# Patient Record
Sex: Male | Born: 1960
Health system: Southern US, Community
[De-identification: ages and names within clinical notes are randomized; demographics above are authoritative.]

## PROBLEM LIST (undated history)

## (undated) HISTORY — PX: KNEE SURGERY: SHX244

---

## 2001-03-01 ENCOUNTER — Ambulatory Visit (HOSPITAL_COMMUNITY): Admission: RE | Admit: 2001-03-01 | Discharge: 2001-03-01 | Payer: Self-pay | Admitting: Specialist

## 2001-03-01 ENCOUNTER — Encounter: Payer: Self-pay | Admitting: Specialist

## 2011-01-27 ENCOUNTER — Ambulatory Visit (HOSPITAL_COMMUNITY)
Admission: RE | Admit: 2011-01-27 | Discharge: 2011-01-27 | Disposition: A | Discharge status: Home / Self Care | Payer: Managed Care, Other (non HMO) | Source: Ambulatory Visit | Attending: Orthopedic Surgery | Admitting: Orthopedic Surgery

## 2011-01-27 DIAGNOSIS — M79609 Pain in unspecified limb: Secondary | ICD-10-CM

## 2011-01-27 DIAGNOSIS — M7989 Other specified soft tissue disorders: Secondary | ICD-10-CM | POA: Insufficient documentation

## 2011-02-22 ENCOUNTER — Other Ambulatory Visit: Payer: Self-pay | Admitting: Orthopedic Surgery

## 2011-02-22 DIAGNOSIS — M25561 Pain in right knee: Secondary | ICD-10-CM

## 2011-02-24 ENCOUNTER — Ambulatory Visit
Admission: RE | Admit: 2011-02-24 | Discharge: 2011-02-24 | Disposition: A | Discharge status: Home / Self Care | Payer: Managed Care, Other (non HMO) | Source: Ambulatory Visit | Attending: Orthopedic Surgery | Admitting: Orthopedic Surgery

## 2011-02-24 DIAGNOSIS — M25561 Pain in right knee: Secondary | ICD-10-CM

## 2018-11-13 DIAGNOSIS — E7800 Pure hypercholesterolemia, unspecified: Secondary | ICD-10-CM | POA: Diagnosis not present

## 2018-11-13 DIAGNOSIS — Z125 Encounter for screening for malignant neoplasm of prostate: Secondary | ICD-10-CM | POA: Diagnosis not present

## 2018-11-13 DIAGNOSIS — Z0000 Encounter for general adult medical examination without abnormal findings: Secondary | ICD-10-CM | POA: Diagnosis not present

## 2019-04-30 DIAGNOSIS — E663 Overweight: Secondary | ICD-10-CM | POA: Diagnosis not present

## 2019-04-30 DIAGNOSIS — E7800 Pure hypercholesterolemia, unspecified: Secondary | ICD-10-CM | POA: Diagnosis not present

## 2019-05-01 DIAGNOSIS — E7800 Pure hypercholesterolemia, unspecified: Secondary | ICD-10-CM | POA: Diagnosis not present

## 2019-07-24 DIAGNOSIS — Z23 Encounter for immunization: Secondary | ICD-10-CM | POA: Diagnosis not present

## 2019-12-07 DIAGNOSIS — Z0000 Encounter for general adult medical examination without abnormal findings: Secondary | ICD-10-CM | POA: Diagnosis not present

## 2019-12-07 DIAGNOSIS — E538 Deficiency of other specified B group vitamins: Secondary | ICD-10-CM | POA: Diagnosis not present

## 2019-12-07 DIAGNOSIS — E7800 Pure hypercholesterolemia, unspecified: Secondary | ICD-10-CM | POA: Diagnosis not present

## 2019-12-07 DIAGNOSIS — Q740 Other congenital malformations of upper limb(s), including shoulder girdle: Secondary | ICD-10-CM | POA: Diagnosis not present

## 2019-12-07 DIAGNOSIS — Z125 Encounter for screening for malignant neoplasm of prostate: Secondary | ICD-10-CM | POA: Diagnosis not present

## 2020-06-24 DIAGNOSIS — Z8249 Family history of ischemic heart disease and other diseases of the circulatory system: Secondary | ICD-10-CM | POA: Diagnosis not present

## 2020-06-24 DIAGNOSIS — Z1389 Encounter for screening for other disorder: Secondary | ICD-10-CM | POA: Diagnosis not present

## 2020-06-24 DIAGNOSIS — E7800 Pure hypercholesterolemia, unspecified: Secondary | ICD-10-CM | POA: Diagnosis not present

## 2020-06-24 DIAGNOSIS — Q740 Other congenital malformations of upper limb(s), including shoulder girdle: Secondary | ICD-10-CM | POA: Diagnosis not present

## 2020-06-24 DIAGNOSIS — E663 Overweight: Secondary | ICD-10-CM | POA: Diagnosis not present

## 2020-07-10 DIAGNOSIS — Z23 Encounter for immunization: Secondary | ICD-10-CM | POA: Diagnosis not present

## 2020-10-14 DIAGNOSIS — Z1152 Encounter for screening for COVID-19: Secondary | ICD-10-CM | POA: Diagnosis not present

## 2021-02-05 DIAGNOSIS — Z0000 Encounter for general adult medical examination without abnormal findings: Secondary | ICD-10-CM | POA: Diagnosis not present

## 2021-02-05 DIAGNOSIS — Z125 Encounter for screening for malignant neoplasm of prostate: Secondary | ICD-10-CM | POA: Diagnosis not present

## 2021-02-05 DIAGNOSIS — E7800 Pure hypercholesterolemia, unspecified: Secondary | ICD-10-CM | POA: Diagnosis not present

## 2021-02-05 DIAGNOSIS — Z789 Other specified health status: Secondary | ICD-10-CM | POA: Diagnosis not present

## 2021-06-11 DIAGNOSIS — E7800 Pure hypercholesterolemia, unspecified: Secondary | ICD-10-CM | POA: Diagnosis not present

## 2021-08-14 ENCOUNTER — Emergency Department (HOSPITAL_BASED_OUTPATIENT_CLINIC_OR_DEPARTMENT_OTHER)
Admission: EM | Admit: 2021-08-14 | Discharge: 2021-08-14 | Disposition: A | Discharge status: Home / Self Care | Payer: BC Managed Care – PPO | Attending: Emergency Medicine | Admitting: Emergency Medicine

## 2021-08-14 ENCOUNTER — Encounter (HOSPITAL_BASED_OUTPATIENT_CLINIC_OR_DEPARTMENT_OTHER): Payer: Self-pay | Admitting: *Deleted

## 2021-08-14 ENCOUNTER — Emergency Department (HOSPITAL_BASED_OUTPATIENT_CLINIC_OR_DEPARTMENT_OTHER): Payer: BC Managed Care – PPO

## 2021-08-14 ENCOUNTER — Other Ambulatory Visit: Payer: Self-pay

## 2021-08-14 DIAGNOSIS — M25511 Pain in right shoulder: Secondary | ICD-10-CM | POA: Insufficient documentation

## 2021-08-14 DIAGNOSIS — S2241XA Multiple fractures of ribs, right side, initial encounter for closed fracture: Secondary | ICD-10-CM

## 2021-08-14 DIAGNOSIS — I700 Atherosclerosis of aorta: Secondary | ICD-10-CM | POA: Diagnosis not present

## 2021-08-14 DIAGNOSIS — Y92410 Unspecified street and highway as the place of occurrence of the external cause: Secondary | ICD-10-CM | POA: Diagnosis not present

## 2021-08-14 DIAGNOSIS — S40011A Contusion of right shoulder, initial encounter: Secondary | ICD-10-CM | POA: Diagnosis not present

## 2021-08-14 DIAGNOSIS — S299XXA Unspecified injury of thorax, initial encounter: Secondary | ICD-10-CM | POA: Diagnosis not present

## 2021-08-14 DIAGNOSIS — J9811 Atelectasis: Secondary | ICD-10-CM | POA: Diagnosis not present

## 2021-08-14 DIAGNOSIS — R0781 Pleurodynia: Secondary | ICD-10-CM | POA: Diagnosis not present

## 2021-08-14 MED ORDER — HYDROCODONE-ACETAMINOPHEN 5-325 MG PO TABS
1.0000 | ORAL_TABLET | Freq: Four times a day (QID) | ORAL | 0 refills | Status: DC | PRN
Start: 2021-08-14 — End: 2022-09-09

## 2021-08-14 MED ORDER — NAPROXEN 500 MG PO TABS: 500.0000 mg | ORAL_TABLET | Freq: Two times a day (BID) | ORAL | 0 refills | Status: DC
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
## 2021-08-14 NOTE — ED Notes (Signed)
Incentive spirometer given. Pt educated. Pt verbalizes understanding

## 2021-08-14 NOTE — ED Provider Notes (Signed)
8:28 PM Signout from [employer].   Patient's x-rays reviewed.  He has multiple rib fractures.  No shoulder dislocation or obvious fracture.  Seen and evaluated.  He looks comfortable but has worsening pain with movement of his arm.  Pain is mainly in the chest wall per his report.  Given extensive number of rib fractures, recommended chest CT to evaluate for other internal injuries.  Patient in agreement.  Family at bedside also agrees.  9:28 PM CT with 3 rib fractures. Pt updated. No other serious injuries identified.   BP 123/78 (BP Location: Left Arm)   Pulse 73   Temp 98.3 F (36.8 C) (Oral)   Resp 14   Ht 6' (1.829 m)   Wt 92.1 kg   SpO2 100%   BMI 27.53 kg/m   Plan: pain meds, shoulder sling, spirometer for home. PCP f/u in 1 week to discuss return to work.   Patient counseled on use of narcotic pain medications. Counseled not to combine these medications with others containing tylenol. Urged not to drink alcohol, drive, or perform any other activities that requires focus while taking these medications. The patient verbalizes understanding and agrees with the plan.    Renne Crigler, PA-C 08/14/21 2346    Melene Plan, DO 08/17/21 0700

## 2021-08-20 DIAGNOSIS — S4991XA Unspecified injury of right shoulder and upper arm, initial encounter: Secondary | ICD-10-CM | POA: Diagnosis not present

## 2021-08-20 DIAGNOSIS — Z23 Encounter for immunization: Secondary | ICD-10-CM | POA: Diagnosis not present

## 2021-08-20 DIAGNOSIS — S2241XA Multiple fractures of ribs, right side, initial encounter for closed fracture: Secondary | ICD-10-CM | POA: Diagnosis not present

## 2021-08-24 DIAGNOSIS — S43101A Unspecified dislocation of right acromioclavicular joint, initial encounter: Secondary | ICD-10-CM | POA: Diagnosis not present

## 2021-08-24 DIAGNOSIS — S2241XA Multiple fractures of ribs, right side, initial encounter for closed fracture: Secondary | ICD-10-CM | POA: Diagnosis not present

## 2021-09-11 DIAGNOSIS — Z8249 Family history of ischemic heart disease and other diseases of the circulatory system: Secondary | ICD-10-CM | POA: Diagnosis not present

## 2021-09-11 DIAGNOSIS — E7800 Pure hypercholesterolemia, unspecified: Secondary | ICD-10-CM | POA: Diagnosis not present

## 2021-09-11 DIAGNOSIS — S43101A Unspecified dislocation of right acromioclavicular joint, initial encounter: Secondary | ICD-10-CM | POA: Diagnosis not present

## 2021-09-11 DIAGNOSIS — S4991XA Unspecified injury of right shoulder and upper arm, initial encounter: Secondary | ICD-10-CM | POA: Diagnosis not present

## 2021-09-14 DIAGNOSIS — S43101D Unspecified dislocation of right acromioclavicular joint, subsequent encounter: Secondary | ICD-10-CM | POA: Diagnosis not present

## 2021-10-07 DIAGNOSIS — S43101D Unspecified dislocation of right acromioclavicular joint, subsequent encounter: Secondary | ICD-10-CM | POA: Diagnosis not present

## 2022-02-08 DIAGNOSIS — Z0000 Encounter for general adult medical examination without abnormal findings: Secondary | ICD-10-CM | POA: Diagnosis not present

## 2022-02-11 DIAGNOSIS — Z125 Encounter for screening for malignant neoplasm of prostate: Secondary | ICD-10-CM | POA: Diagnosis not present

## 2022-02-11 DIAGNOSIS — E7800 Pure hypercholesterolemia, unspecified: Secondary | ICD-10-CM | POA: Diagnosis not present

## 2022-02-11 DIAGNOSIS — N401 Enlarged prostate with lower urinary tract symptoms: Secondary | ICD-10-CM | POA: Diagnosis not present

## 2022-02-11 DIAGNOSIS — Z789 Other specified health status: Secondary | ICD-10-CM | POA: Diagnosis not present

## 2022-02-11 DIAGNOSIS — N529 Male erectile dysfunction, unspecified: Secondary | ICD-10-CM | POA: Diagnosis not present

## 2022-03-16 DIAGNOSIS — H903 Sensorineural hearing loss, bilateral: Secondary | ICD-10-CM | POA: Diagnosis not present

## 2022-04-27 DIAGNOSIS — R3914 Feeling of incomplete bladder emptying: Secondary | ICD-10-CM | POA: Diagnosis not present

## 2022-04-27 DIAGNOSIS — N401 Enlarged prostate with lower urinary tract symptoms: Secondary | ICD-10-CM | POA: Diagnosis not present

## 2022-04-27 DIAGNOSIS — R3912 Poor urinary stream: Secondary | ICD-10-CM | POA: Diagnosis not present

## 2022-07-19 DIAGNOSIS — R3912 Poor urinary stream: Secondary | ICD-10-CM | POA: Diagnosis not present

## 2022-07-19 DIAGNOSIS — R3914 Feeling of incomplete bladder emptying: Secondary | ICD-10-CM | POA: Diagnosis not present

## 2022-07-19 DIAGNOSIS — N401 Enlarged prostate with lower urinary tract symptoms: Secondary | ICD-10-CM | POA: Diagnosis not present

## 2022-07-19 DIAGNOSIS — H903 Sensorineural hearing loss, bilateral: Secondary | ICD-10-CM | POA: Diagnosis not present

## 2022-07-26 DIAGNOSIS — R3912 Poor urinary stream: Secondary | ICD-10-CM | POA: Diagnosis not present

## 2022-07-26 DIAGNOSIS — R3914 Feeling of incomplete bladder emptying: Secondary | ICD-10-CM | POA: Diagnosis not present

## 2022-07-26 DIAGNOSIS — N139 Obstructive and reflux uropathy, unspecified: Secondary | ICD-10-CM | POA: Diagnosis not present

## 2022-07-26 DIAGNOSIS — N401 Enlarged prostate with lower urinary tract symptoms: Secondary | ICD-10-CM | POA: Diagnosis not present

## 2022-09-01 ENCOUNTER — Ambulatory Visit
Admission: RE | Admit: 2022-09-01 | Discharge: 2022-09-01 | Disposition: A | Discharge status: Home / Self Care | Payer: BC Managed Care – PPO | Source: Ambulatory Visit | Attending: Family Medicine | Admitting: Family Medicine

## 2022-09-01 ENCOUNTER — Other Ambulatory Visit: Payer: Self-pay | Admitting: Family Medicine

## 2022-09-01 DIAGNOSIS — M47816 Spondylosis without myelopathy or radiculopathy, lumbar region: Secondary | ICD-10-CM | POA: Diagnosis not present

## 2022-09-01 DIAGNOSIS — M25552 Pain in left hip: Secondary | ICD-10-CM | POA: Diagnosis not present

## 2022-09-01 DIAGNOSIS — M160 Bilateral primary osteoarthritis of hip: Secondary | ICD-10-CM | POA: Diagnosis not present

## 2022-09-01 DIAGNOSIS — M25551 Pain in right hip: Secondary | ICD-10-CM

## 2022-09-01 DIAGNOSIS — M47817 Spondylosis without myelopathy or radiculopathy, lumbosacral region: Secondary | ICD-10-CM | POA: Diagnosis not present

## 2022-09-08 DIAGNOSIS — R3912 Poor urinary stream: Secondary | ICD-10-CM | POA: Diagnosis not present

## 2022-09-08 DIAGNOSIS — R3914 Feeling of incomplete bladder emptying: Secondary | ICD-10-CM | POA: Diagnosis not present

## 2022-09-08 DIAGNOSIS — N401 Enlarged prostate with lower urinary tract symptoms: Secondary | ICD-10-CM | POA: Diagnosis not present

## 2022-09-09 ENCOUNTER — Encounter (HOSPITAL_BASED_OUTPATIENT_CLINIC_OR_DEPARTMENT_OTHER): Payer: Self-pay | Admitting: Urology

## 2022-09-09 ENCOUNTER — Other Ambulatory Visit (HOSPITAL_BASED_OUTPATIENT_CLINIC_OR_DEPARTMENT_OTHER): Payer: Self-pay

## 2022-09-09 ENCOUNTER — Emergency Department (HOSPITAL_BASED_OUTPATIENT_CLINIC_OR_DEPARTMENT_OTHER)
Admission: EM | Admit: 2022-09-09 | Discharge: 2022-09-09 | Disposition: A | Discharge status: Home / Self Care | Payer: BC Managed Care – PPO | Attending: Emergency Medicine | Admitting: Emergency Medicine

## 2022-09-09 ENCOUNTER — Emergency Department (HOSPITAL_BASED_OUTPATIENT_CLINIC_OR_DEPARTMENT_OTHER): Payer: BC Managed Care – PPO

## 2022-09-09 ENCOUNTER — Other Ambulatory Visit: Payer: Self-pay

## 2022-09-09 DIAGNOSIS — N132 Hydronephrosis with renal and ureteral calculous obstruction: Secondary | ICD-10-CM | POA: Diagnosis not present

## 2022-09-09 DIAGNOSIS — K6389 Other specified diseases of intestine: Secondary | ICD-10-CM | POA: Diagnosis not present

## 2022-09-09 DIAGNOSIS — R1031 Right lower quadrant pain: Secondary | ICD-10-CM | POA: Diagnosis not present

## 2022-09-09 DIAGNOSIS — R6883 Chills (without fever): Secondary | ICD-10-CM | POA: Diagnosis not present

## 2022-09-09 DIAGNOSIS — I700 Atherosclerosis of aorta: Secondary | ICD-10-CM | POA: Diagnosis not present

## 2022-09-09 DIAGNOSIS — N3289 Other specified disorders of bladder: Secondary | ICD-10-CM | POA: Diagnosis not present

## 2022-09-09 DIAGNOSIS — R112 Nausea with vomiting, unspecified: Secondary | ICD-10-CM | POA: Diagnosis not present

## 2022-09-09 LAB — CBC
HCT: 45.1 % (ref 39.0–52.0)
Hemoglobin: 15.4 g/dL (ref 13.0–17.0)
MCH: 31.4 pg (ref 26.0–34.0)
MCHC: 34.1 g/dL (ref 30.0–36.0)
MCV: 91.9 fL (ref 80.0–100.0)
Platelets: 260 10*3/uL (ref 150–400)
RBC: 4.91 MIL/uL (ref 4.22–5.81)
RDW: 12 % (ref 11.5–15.5)
WBC: 11.4 10*3/uL — ABNORMAL HIGH (ref 4.0–10.5)
nRBC: 0 % (ref 0.0–0.2)

## 2022-09-09 LAB — COMPREHENSIVE METABOLIC PANEL
ALT: 19 U/L (ref 0–44)
AST: 30 U/L (ref 15–41)
Albumin: 4.4 g/dL (ref 3.5–5.0)
Alkaline Phosphatase: 59 U/L (ref 38–126)
Anion gap: 8 (ref 5–15)
BUN: 18 mg/dL (ref 6–20)
CO2: 25 mmol/L (ref 22–32)
Calcium: 9.4 mg/dL (ref 8.9–10.3)
Chloride: 105 mmol/L (ref 98–111)
Creatinine, Ser: 1.23 mg/dL (ref 0.61–1.24)
GFR, Estimated: >60 mL/min (ref >60)
Glucose, Bld: 103 mg/dL — ABNORMAL HIGH (ref 70–99)
Potassium: 4.4 mmol/L (ref 3.5–5.1)
Sodium: 138 mmol/L (ref 135–145)
Total Bilirubin: 1 mg/dL (ref 0.3–1.2)
Total Protein: 8.1 g/dL (ref 6.5–8.1)

## 2022-09-09 LAB — URINALYSIS, ROUTINE W REFLEX MICROSCOPIC
Bilirubin Urine: NEGATIVE
Glucose, UA: NEGATIVE mg/dL
Ketones, ur: NEGATIVE mg/dL
Leukocytes,Ua: NEGATIVE
Nitrite: NEGATIVE
Protein, ur: NEGATIVE mg/dL
Specific Gravity, Urine: 1.02 (ref 1.005–1.030)
pH: 7 (ref 5.0–8.0)

## 2022-09-09 LAB — LIPASE, BLOOD: Lipase: 30 U/L (ref 11–51)

## 2022-09-09 LAB — URINALYSIS, MICROSCOPIC (REFLEX): WBC, UA: NONE SEEN WBC/hpf (ref 0–5)

## 2022-09-09 MED ORDER — SODIUM CHLORIDE 0.9 % IV BOLUS
1000.0000 mL | Freq: Once | INTRAVENOUS | Status: AC
  Administered 2022-09-09: 1000 mL via INTRAVENOUS

## 2022-09-09 MED ORDER — KETOROLAC TROMETHAMINE 30 MG/ML IJ SOLN
15.0000 mg | Freq: Once | INTRAMUSCULAR | Status: AC
  Administered 2022-09-09: 15 mg via INTRAVENOUS
  Filled 2022-09-09: qty 1

## 2022-09-09 MED ORDER — OXYCODONE-ACETAMINOPHEN 5-325 MG PO TABS
1.0000 | ORAL_TABLET | Freq: Four times a day (QID) | ORAL | 0 refills | Status: DC | PRN
Start: 2022-09-09 — End: 2022-09-13
  Filled 2022-09-09: qty 12, 2d supply, fill #0

## 2022-09-09 MED ORDER — ONDANSETRON HCL 4 MG PO TABS
4.0000 mg | ORAL_TABLET | Freq: Four times a day (QID) | ORAL | 0 refills | Status: AC
  Filled 2022-09-09: qty 12, 3d supply, fill #0

## 2022-09-09 MED ORDER — ONDANSETRON HCL 4 MG/2ML IJ SOLN
4.0000 mg | Freq: Once | INTRAMUSCULAR | Status: AC
  Administered 2022-09-09: 4 mg via INTRAVENOUS
  Filled 2022-09-09: qty 2

## 2022-09-09 MED ORDER — ONDANSETRON HCL 4 MG PO TABS: 4.0000 mg | ORAL_TABLET | Freq: Four times a day (QID) | ORAL | 0 refills | Status: DC

## 2022-09-09 MED ORDER — MORPHINE SULFATE (PF) 4 MG/ML IV SOLN
4.0000 mg | Freq: Once | INTRAVENOUS | Status: AC
  Administered 2022-09-09: 4 mg via INTRAVENOUS
  Filled 2022-09-09: qty 1

## 2022-09-09 MED ORDER — OXYCODONE-ACETAMINOPHEN 5-325 MG PO TABS: 1.0000 | ORAL_TABLET | Freq: Four times a day (QID) | ORAL | 0 refills | Status: DC | PRN

## 2022-09-09 MED ORDER — IBUPROFEN 600 MG PO TABS: 600.0000 mg | ORAL_TABLET | Freq: Four times a day (QID) | ORAL | 0 refills | Status: DC | PRN

## 2022-09-09 MED ORDER — IBUPROFEN 600 MG PO TABS
600.0000 mg | ORAL_TABLET | Freq: Four times a day (QID) | ORAL | 0 refills | Status: AC | PRN
  Filled 2022-09-09: qty 30, 8d supply, fill #0

## 2022-09-09 NOTE — ED Provider Notes (Incomplete)
[facility] EMERGENCY DEPARTMENT Provider Note   CSN: [id number] Arrival date & time: 09/09/22  1107     History {Add pertinent medical, surgical, social history, OB history to HPI:1} Chief Complaint  Patient presents with   Abdominal Pain    Jeffrey Nadeau Sr. is a 61 y.o. male.   Abdominal Pain      Home Medications Prior to Admission medications   Medication Sig Start Date End Date Taking? Authorizing Provider  HYDROcodone-acetaminophen (NORCO/VICODIN) 5-325 MG tablet Take 1 tablet by mouth every 6 (six) hours as needed for severe pain. 08/14/21   Renne Crigler, PA-C  naproxen (NAPROSYN) 500 MG tablet Take 1 tablet (500 mg total) by mouth 2 (two) times daily. 08/14/21   Renne Crigler, PA-C      Allergies    Patient has no known allergies.    Review of Systems   Review of Systems  Gastrointestinal:  Positive for abdominal pain.    Physical Exam Updated Vital Signs BP (!) 143/86 (BP Location: Left Arm)   Pulse 63   Temp 98 F (36.7 C) (Oral)   Resp 18   Ht 6' (1.829 m)   Wt 95.7 kg   SpO2 100%   BMI 28.62 kg/m  Physical Exam  ED Results / Procedures / Treatments   Labs (all labs ordered are listed, but only abnormal results are displayed) Labs Reviewed  COMPREHENSIVE METABOLIC PANEL - Abnormal; Notable for the following components:      Result Value   Glucose, Bld 103 (*)    All other components within normal limits  CBC - Abnormal; Notable for the following components:   WBC 11.4 (*)    All other components within normal limits  LIPASE, BLOOD  URINALYSIS, ROUTINE W REFLEX MICROSCOPIC    EKG None  Radiology CT Renal Stone Study  Result Date: 09/09/2022 CLINICAL DATA:  Abdominal pain, flank pain with suspected kidney stone in a 61 year old male. EXAM: CT ABDOMEN AND PELVIS WITHOUT CONTRAST TECHNIQUE: Multidetector CT imaging of the abdomen and pelvis was performed following the standard protocol without IV contrast. RADIATION DOSE  REDUCTION: This exam was performed according to the departmental dose-optimization program which includes automated exposure control, adjustment of the mA and/or kV according to patient size and/or use of iterative reconstruction technique. COMPARISON:  Renal ultrasound from July 26, 2022 FINDINGS: Lower chest: No acute findings at the lung bases. Coronary artery calcification of LEFT and RIGHT coronary circulation. Heart is incompletely imaged. No pericardial effusion. No chest wall abnormality. Hepatobiliary: Smooth hepatic contours. No pericholecystic stranding. No gross biliary duct distension. No visible lesion on noncontrast imaging. Pancreas: Pancreas with normal contours, no signs of inflammation or peripancreatic fluid. Spleen: Normal. Adrenals/Urinary Tract: Adrenal glands are normal. Smooth renal contours but with moderate perinephric stranding on the RIGHT. No perinephric fluid. Moderate RIGHT-sided hydronephrosis and hydroureter secondary to an obstructing mid RIGHT ureteral calculus measuring 6 x 7 mm. No additional renal calculi. Slightly lower attenuation of the RIGHT kidney likely related to renal edema in the setting of ureteral obstruction. Urinary bladder is distended and trabeculated. No dilation of the LEFT ureter despite urinary bladder distension. Stomach/Bowel: Stomach without signs of distension or inflammation. Inflammation about the duodenum is likely secondary due to perinephric and renal edema in the RIGHT abdomen. Under distended loops of small bowel without signs of inflammation elsewhere. Appendix not visualized, no secondary signs that would suggest acute appendicitis. Moderate stool in the ascending colon. No signs of colonic inflammation. Vascular/Lymphatic: Aortic  atherosclerosis. No sign of aneurysm. Smooth contour of the IVC. There is no gastrohepatic or hepatoduodenal ligament lymphadenopathy. No retroperitoneal or mesenteric lymphadenopathy. No pelvic sidewall  lymphadenopathy. Limited assessment of vascular structures due to lack of intravenous contrast. Reproductive: Unremarkable by CT. Other: No ascites. Musculoskeletal: No acute bone finding. No destructive bone process. Spinal degenerative changes. IMPRESSION: 1. Obstructing RIGHT mid ureteral calculus with moderate hydronephrosis, moderate perinephric stranding and signs of renal edema. Calculus approximately 6-7 mm 2. No additional renal calculi. 3. Urinary bladder is distended and trabeculated. Findings suggest bladder outlet obstruction or neurogenic bladder. Correlate clinically. 4. Aortic atherosclerosis and coronary artery disease. There is substantial calcium along LEFT and RIGHT coronary circulation. Aortic Atherosclerosis (ICD10-I70.0). Electronically Signed   By: Donzetta Kohut M.D.   On: 09/09/2022 13:50    Procedures Procedures  {Document cardiac monitor, telemetry assessment procedure when appropriate:1}  Medications Ordered in ED Medications  morphine (PF) 4 MG/ML injection 4 mg (4 mg Intravenous Given 09/09/22 1333)  ondansetron (ZOFRAN) injection 4 mg (4 mg Intravenous Given 09/09/22 1332)  sodium chloride 0.9 % bolus 1,000 mL (1,000 mLs Intravenous New Bag/Given 09/09/22 1330)    ED Course/ Medical Decision Making/ A&P                           Medical Decision Making Amount and/or Complexity of Data Reviewed Labs: ordered. Radiology: ordered.  Risk Prescription drug management.   ***  {Document critical care time when appropriate:1} {Document review of labs and clinical decision tools ie heart score, Chads2Vasc2 etc:1}  {Document your independent review of radiology images, and any outside records:1} {Document your discussion with family members, caretakers, and with consultants:1} {Document social determinants of health affecting pt's care:1} {Document your decision making why or why not admission, treatments were needed:1} Final Clinical Impression(s) / ED  Diagnoses Final diagnoses:  None    Rx / DC Orders ED Discharge Orders     None

## 2022-09-09 NOTE — Discharge Instructions (Addendum)
You were seen in the emergency department and found to have a kidney stone.  We are sending you home with multiple medications to assist with passing the stone:   -Flomax-continue taking this home medication as prescribed  -Ibuprofen 600 mg-this is a medication that will help with pain as well as passing the stone.  Please take this every 6 hours.  Take this with food as it can cause stomach upset and at worst stomach bleeding.  Do not take other NSAIDs such as Motrin, Aleve, Advil, Mobic, or Naproxen with this medicine as they are similar and would propagate any potential side effects.   -Percocet-this is a narcotic/controlled substance medication that has potential addicting qualities.  We recommend that you take 1-2 tablets every 6 hours as needed for severe pain.  Do not drive or operate heavy machinery when taking this medicine as it can be sedating. Do not drink alcohol or take other sedating medications when taking this medicine for safety reasons.  Keep this out of reach of small children.  Please be aware this medicine has Tylenol in it (325 mg/tab) do not exceed the maximum dose of Tylenol in a day per over the counter recommendations should you decide to supplement with Tylenol over the counter.   -Zofran-this is an antinausea medication, you may take this every 8 hours as needed for nausea and vomiting, please allow the tablet to dissolve underneath of your tongue.   - You can also take an over the counter magnesium supplement (magnesium glycinate) to help relax muscles in ureters  We have prescribed you new medication(s) today. Discuss the medications prescribed today with your pharmacist as they can have adverse effects and interactions with your other medicines including over the counter and prescribed medications. Seek medical evaluation if you start to experience new or abnormal symptoms after taking one of these medicines, seek care immediately if you start to experience difficulty  breathing, feeling of your throat closing, facial swelling, or rash as these could be indications of a more serious allergic reaction  Please follow-up with the urology group, call tomorrow morning to schedule follow up.  Return to the ER for new or worsening symptoms including but not limited to worsening pain not controlled by these medicines, inability to keep fluids down, fever, or any other concerns that you may have.

## 2022-09-09 NOTE — ED Triage Notes (Signed)
RLQ pain that started last night at 2300, also states N/V throughout the night Denies fever

## 2022-09-10 ENCOUNTER — Other Ambulatory Visit: Payer: Self-pay | Admitting: Urology

## 2022-09-10 ENCOUNTER — Encounter (HOSPITAL_BASED_OUTPATIENT_CLINIC_OR_DEPARTMENT_OTHER): Payer: Self-pay | Admitting: Urology

## 2022-09-10 DIAGNOSIS — N201 Calculus of ureter: Secondary | ICD-10-CM | POA: Diagnosis not present

## 2022-09-10 NOTE — Progress Notes (Signed)
Pre-op phone call complete. Procedure date and arrival time confirmed. Patient allergies, medical history, and medications verified. Patient advised to stop vitamins and not have NSAIDs within 48 hours prior to procedure. Patient denies any recent history of chest pain or COVID. Patient to be NPO at midnight and can have clear liquids until 0200. Driver secured.

## 2022-09-10 NOTE — Progress Notes (Signed)
Pre-op phone call attempted. Left voicemail for patient to return call.  

## 2022-09-13 ENCOUNTER — Ambulatory Visit (HOSPITAL_COMMUNITY): Payer: BC Managed Care – PPO

## 2022-09-13 ENCOUNTER — Other Ambulatory Visit: Payer: Self-pay

## 2022-09-13 ENCOUNTER — Ambulatory Visit (HOSPITAL_BASED_OUTPATIENT_CLINIC_OR_DEPARTMENT_OTHER)
Admission: RE | Admit: 2022-09-13 | Discharge: 2022-09-13 | Disposition: A | Discharge status: Home / Self Care | Payer: BC Managed Care – PPO | Attending: Urology | Admitting: Urology

## 2022-09-13 ENCOUNTER — Encounter (HOSPITAL_BASED_OUTPATIENT_CLINIC_OR_DEPARTMENT_OTHER)
Admission: RE | Disposition: A | Discharge status: Home / Self Care | Payer: Self-pay | Source: Home / Self Care | Attending: Urology

## 2022-09-13 ENCOUNTER — Encounter (HOSPITAL_BASED_OUTPATIENT_CLINIC_OR_DEPARTMENT_OTHER): Payer: Self-pay | Admitting: Urology

## 2022-09-13 DIAGNOSIS — N201 Calculus of ureter: Secondary | ICD-10-CM | POA: Diagnosis not present

## 2022-09-13 HISTORY — PX: EXTRACORPOREAL SHOCK WAVE LITHOTRIPSY: SHX1557

## 2022-09-13 SURGERY — LITHOTRIPSY, ESWL
Anesthesia: LOCAL | Laterality: Right

## 2022-09-13 MED ORDER — DIPHENHYDRAMINE HCL 25 MG PO CAPS
ORAL_CAPSULE | ORAL | Status: AC
  Filled 2022-09-13: qty 1

## 2022-09-13 MED ORDER — OXYCODONE-ACETAMINOPHEN 5-325 MG PO TABS: 1.0000 | ORAL_TABLET | Freq: Four times a day (QID) | ORAL | 0 refills | Status: AC | PRN

## 2022-09-13 MED ORDER — LEVOFLOXACIN IN D5W 500 MG/100ML IV SOLN
INTRAVENOUS | Status: AC
  Filled 2022-09-13: qty 100

## 2022-09-13 MED ORDER — SODIUM CHLORIDE 0.9 % IV SOLN: INTRAVENOUS | Status: DC

## 2022-09-13 MED ORDER — LEVOFLOXACIN 500 MG PO TABS
500.0000 mg | ORAL_TABLET | ORAL | Status: AC
  Administered 2022-09-13: 500 mg via ORAL
  Filled 2022-09-13: qty 1

## 2022-09-13 MED ORDER — DIPHENHYDRAMINE HCL 25 MG PO CAPS
25.0000 mg | ORAL_CAPSULE | ORAL | Status: AC
  Administered 2022-09-13: 25 mg via ORAL

## 2022-09-13 MED ORDER — DIAZEPAM 5 MG PO TABS
ORAL_TABLET | ORAL | Status: AC
  Filled 2022-09-13: qty 2

## 2022-09-13 MED ORDER — DIAZEPAM 5 MG PO TABS
10.0000 mg | ORAL_TABLET | ORAL | Status: AC
  Administered 2022-09-13: 10 mg via ORAL

## 2022-09-13 NOTE — Progress Notes (Signed)
Slightly reddened, blotchy area notes on right flank from [facility].

## 2022-09-13 NOTE — H&P (Signed)
See scanned H&P

## 2022-09-13 NOTE — Op Note (Signed)
See [facility] OP note scanned into chart. Also because of the size, density, location and other factors that cannot be anticipated I feel this will likely be a staged procedure. This fact supersedes any indication in the scanned [facility] operative note to the contrary.  

## 2022-09-14 ENCOUNTER — Encounter (HOSPITAL_BASED_OUTPATIENT_CLINIC_OR_DEPARTMENT_OTHER): Payer: Self-pay | Admitting: Urology

## 2022-10-05 DIAGNOSIS — N201 Calculus of ureter: Secondary | ICD-10-CM | POA: Diagnosis not present

## 2022-10-14 NOTE — ED Provider Notes (Signed)
[facility] EMERGENCY DEPARTMENT Provider Note   CSN: [id number] Arrival date & time: 09/09/22  1107     History  Chief Complaint  Patient presents with   Abdominal Pain    Jeffrey Dudley. is a 62 y.o. male.  Jeffrey Dudley. is a 62 y.o. male who is otherwise healthy, presents to the ED for evaluation of right lower quadrant abdominal pain.  Pain started suddenly around 11 PM last night.  Patient describes pain as sharp and stabbing radiating to the right flank and into the groin.  He reports that the pain seems to come in waves and he cannot get comfortable when pain is severe.  Pain is not made worse with movement.  He reports associated nausea and vomiting throughout the night.  No fevers, no change in bowels.  Has not noted any hematuria or dysuria.  No prior abdominal surgeries.  No history of urinary tract infection or kidney stones.  The history is provided by the patient, the spouse and medical records.  Abdominal Pain Associated symptoms: nausea and vomiting   Associated symptoms: no chills, no cough, no diarrhea, no dysuria, no fever, no hematuria and no shortness of breath        Home Medications Prior to Admission medications   Medication Sig Start Date End Date Taking? Authorizing Provider  ibuprofen (ADVIL) 600 MG tablet Take 1 tablet (600 mg total) by mouth every 6 (six) hours as needed. 09/09/22   Jacqlyn Larsen, PA-C  ondansetron (ZOFRAN) 4 MG tablet Take 1 tablet (4 mg total) by mouth every 6 (six) hours. 09/09/22   Jacqlyn Larsen, PA-C  oxyCODONE-acetaminophen (PERCOCET/ROXICET) 5-325 MG tablet Take 1-2 tablets by mouth every 6 (six) hours as needed for severe pain. 09/13/22   Lucas Mallow, MD  tamsulosin (FLOMAX) 0.4 MG CAPS capsule Take 0.4 mg by mouth in the morning and at bedtime.    [provider]  vitamin B-12 (CYANOCOBALAMIN) 500 MCG tablet Take 500 mcg by mouth daily.    [provider]      Allergies    Patient  has no known allergies.    Review of Systems   Review of Systems  Constitutional:  Negative for chills and fever.  HENT: Negative.    Respiratory:  Negative for cough and shortness of breath.   Gastrointestinal:  Positive for abdominal pain, nausea and vomiting. Negative for diarrhea.  Genitourinary:  Positive for flank pain. Negative for dysuria and hematuria.  Musculoskeletal:  Negative for arthralgias and myalgias.  Skin:  Negative for color change and rash.  Neurological:  Negative for dizziness, syncope and light-headedness.    Physical Exam Updated Vital Signs BP 120/79 (BP Location: Right Arm)   Pulse 61   Temp 98.5 F (36.9 C) (Oral)   Resp 16   Ht 6' (1.829 m)   Wt 95.7 kg   SpO2 99%   BMI 28.62 kg/m  Physical Exam Vitals and nursing note reviewed.  Constitutional:      General: He is not in acute distress.    Appearance: Normal appearance. He is well-developed. He is not diaphoretic.     Comments: Pt appears, uncomfortable, difficulty sitting still  HENT:     Head: Normocephalic and atraumatic.  Eyes:     General:        Right eye: No discharge.        Left eye: No discharge.     Pupils: Pupils are equal, round, and reactive  to light.  Cardiovascular:     Rate and Rhythm: Normal rate and regular rhythm.     Pulses: Normal pulses.     Heart sounds: Normal heart sounds.  Pulmonary:     Effort: Pulmonary effort is normal. No respiratory distress.     Breath sounds: Normal breath sounds. No wheezing or rales.     Comments: Respirations equal and unlabored, patient able to speak in full sentences, lungs clear to auscultation bilaterally  Abdominal:     General: Bowel sounds are normal. There is no distension.     Palpations: Abdomen is soft. There is no mass.     Tenderness: There is abdominal tenderness in the right lower quadrant. There is right CVA tenderness. There is no guarding.     Comments: Abdomen soft, nondistended, tender in RLQ and right flank, +  right CVA tenderness, no guarding or rebound tenderness  Musculoskeletal:        General: No deformity.     Cervical back: Neck supple.  Skin:    General: Skin is warm and dry.     Capillary Refill: Capillary refill takes less than 2 seconds.  Neurological:     Mental Status: He is alert and oriented to person, place, and time.     Coordination: Coordination normal.     Comments: Speech is clear, able to follow commands Moves extremities without ataxia, coordination intact  Psychiatric:        Mood and Affect: Mood normal.        Behavior: Behavior normal.     ED Results / Procedures / Treatments   Labs (all labs ordered are listed, but only abnormal results are displayed) Labs Reviewed  COMPREHENSIVE METABOLIC PANEL - Abnormal; Notable for the following components:      Result Value   Glucose, Bld 103 (*)    All other components within normal limits  CBC - Abnormal; Notable for the following components:   WBC 11.4 (*)    All other components within normal limits  URINALYSIS, ROUTINE W REFLEX MICROSCOPIC - Abnormal; Notable for the following components:   Hgb urine dipstick MODERATE (*)    All other components within normal limits  URINALYSIS, MICROSCOPIC (REFLEX) - Abnormal; Notable for the following components:   Bacteria, UA RARE (*)    All other components within normal limits  LIPASE, BLOOD    EKG None  Radiology  CT Renal Stone Study  Result Date: 09/09/2022 CLINICAL DATA:  Abdominal pain, flank pain with suspected kidney stone in a 62 year old male. EXAM: CT ABDOMEN AND PELVIS WITHOUT CONTRAST TECHNIQUE: Multidetector CT imaging of the abdomen and pelvis was performed following the standard protocol without IV contrast. RADIATION DOSE REDUCTION: This exam was performed according to the departmental dose-optimization program which includes automated exposure control, adjustment of the mA and/or kV according to patient size and/or use of iterative reconstruction  technique. COMPARISON:  Renal ultrasound from July 26, 2022 FINDINGS: Lower chest: No acute findings at the lung bases. Coronary artery calcification of LEFT and RIGHT coronary circulation. Heart is incompletely imaged. No pericardial effusion. No chest wall abnormality. Hepatobiliary: Smooth hepatic contours. No pericholecystic stranding. No gross biliary duct distension. No visible lesion on noncontrast imaging. Pancreas: Pancreas with normal contours, no signs of inflammation or peripancreatic fluid. Spleen: Normal. Adrenals/Urinary Tract: Adrenal glands are normal. Smooth renal contours but with moderate perinephric stranding on the RIGHT. No perinephric fluid. Moderate RIGHT-sided hydronephrosis and hydroureter secondary to an obstructing mid RIGHT ureteral calculus measuring  6 x 7 mm. No additional renal calculi. Slightly lower attenuation of the RIGHT kidney likely related to renal edema in the setting of ureteral obstruction. Urinary bladder is distended and trabeculated. No dilation of the LEFT ureter despite urinary bladder distension. Stomach/Bowel: Stomach without signs of distension or inflammation. Inflammation about the duodenum is likely secondary due to perinephric and renal edema in the RIGHT abdomen. Under distended loops of small bowel without signs of inflammation elsewhere. Appendix not visualized, no secondary signs that would suggest acute appendicitis. Moderate stool in the ascending colon. No signs of colonic inflammation. Vascular/Lymphatic: Aortic atherosclerosis. No sign of aneurysm. Smooth contour of the IVC. There is no gastrohepatic or hepatoduodenal ligament lymphadenopathy. No retroperitoneal or mesenteric lymphadenopathy. No pelvic sidewall lymphadenopathy. Limited assessment of vascular structures due to lack of intravenous contrast. Reproductive: Unremarkable by CT. Other: No ascites. Musculoskeletal: No acute bone finding. No destructive bone process. Spinal degenerative  changes. IMPRESSION: 1. Obstructing RIGHT mid ureteral calculus with moderate hydronephrosis, moderate perinephric stranding and signs of renal edema. Calculus approximately 6-7 mm 2. No additional renal calculi. 3. Urinary bladder is distended and trabeculated. Findings suggest bladder outlet obstruction or neurogenic bladder. Correlate clinically. 4. Aortic atherosclerosis and coronary artery disease. There is substantial calcium along LEFT and RIGHT coronary circulation. Aortic Atherosclerosis (ICD10-I70.0). Electronically Signed   By: Zetta Bills M.D.   On: 09/09/2022 13:50    Procedures Procedures    Medications Ordered in ED Medications  morphine (PF) 4 MG/ML injection 4 mg (4 mg Intravenous Given 09/09/22 1333)  ondansetron (ZOFRAN) injection 4 mg (4 mg Intravenous Given 09/09/22 1332)  sodium chloride 0.9 % bolus 1,000 mL (0 mLs Intravenous Stopped 09/09/22 1453)  morphine (PF) 4 MG/ML injection 4 mg (4 mg Intravenous Given 09/09/22 1452)  ketorolac (TORADOL) 30 MG/ML injection 15 mg (15 mg Intravenous Given 09/09/22 1452)    ED Course/ Medical Decision Making/ A&P                           Medical Decision Making Amount and/or Complexity of Data Reviewed Labs: ordered. Radiology: ordered.  Risk Prescription drug management.   Patient presents to the ED with complaints of RLQ abdominal pain and right flank pain. Patient nontoxic appearing, vitals without significant abnormalities. On exam patient is tender in RLQ and over right flank, no peritoneal signs. DDX: nephrolithiasis, pyelonephritis/UTI, cholecystitis, pancreatitis, bowel obstruction/perforation, appendicitis, dissection, feel nephrolithiasis is most likely at this time will evaluate with labs and CT renal study, analgesics, anti-emetics, and fluids ordered.   Minimal leukocytosis, no anemia, or significant electrolyte derangements.  CT renal study with 6-7 mm obstructing RIGHT mid ureteral calculus with moderate  hydronephrosis, moderate perinephric stranding and signs of renal edema. Renal function preserved. Urinalysis without appearance of superimposed infection. Rare bacteria noted, 6-10 RBCs present.  Discussed with urology given moderate hydro and signs of renal edema, since patient's pain is well-controlled and he is without any signs of infection they recommend discharge home with outpatient supportive treatment and they will see patient closely in office for follow-up.  Patient tolerating PO in the ER with pain well controlled. Will discharge home with Flomax, Zofran, NSAID, and Percocet with urology follow up. [place] Controlled Substance reporting System queried. I discussed results, treatment plan, need for urology follow-up, and return precautions with the patient. Provided opportunity for questions, patient confirmed understanding and is in agreement with plan.  Final Clinical Impression(s) / ED Diagnoses Final diagnoses:  Ureteral stone with hydronephrosis    Rx / DC Orders ED Discharge Orders          Ordered    ibuprofen (ADVIL) 600 MG tablet  Every 6 hours PRN        09/09/22 1629    ondansetron (ZOFRAN) 4 MG tablet  Every 6 hours        09/09/22 1629    oxyCODONE-acetaminophen (PERCOCET/ROXICET) 5-325 MG tablet  Every 6 hours PRN   09/09/22 1629              Dartha Lodge, PA-C 10/14/22 1255    Loetta Rough, MD 10/15/22 [phone]

## 2022-10-15 DIAGNOSIS — N201 Calculus of ureter: Secondary | ICD-10-CM | POA: Diagnosis not present

## 2023-02-16 DIAGNOSIS — M160 Bilateral primary osteoarthritis of hip: Secondary | ICD-10-CM | POA: Diagnosis not present

## 2023-02-16 DIAGNOSIS — Z23 Encounter for immunization: Secondary | ICD-10-CM | POA: Diagnosis not present

## 2023-02-16 DIAGNOSIS — E539 Vitamin B deficiency, unspecified: Secondary | ICD-10-CM | POA: Diagnosis not present

## 2023-02-16 DIAGNOSIS — Z789 Other specified health status: Secondary | ICD-10-CM | POA: Diagnosis not present

## 2023-02-16 DIAGNOSIS — E7800 Pure hypercholesterolemia, unspecified: Secondary | ICD-10-CM | POA: Diagnosis not present

## 2023-02-16 DIAGNOSIS — Z125 Encounter for screening for malignant neoplasm of prostate: Secondary | ICD-10-CM | POA: Diagnosis not present

## 2023-06-30 DIAGNOSIS — Z23 Encounter for immunization: Secondary | ICD-10-CM | POA: Diagnosis not present
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
## 2023-12-27 DIAGNOSIS — M25552 Pain in left hip: Secondary | ICD-10-CM | POA: Diagnosis not present

## 2023-12-27 DIAGNOSIS — G8929 Other chronic pain: Secondary | ICD-10-CM | POA: Diagnosis not present

## 2023-12-27 DIAGNOSIS — R2689 Other abnormalities of gait and mobility: Secondary | ICD-10-CM | POA: Diagnosis not present

## 2023-12-31 DIAGNOSIS — M25552 Pain in left hip: Secondary | ICD-10-CM | POA: Diagnosis not present

## 2024-01-09 DIAGNOSIS — M1612 Unilateral primary osteoarthritis, left hip: Secondary | ICD-10-CM | POA: Diagnosis not present

## 2024-01-18 DIAGNOSIS — M1612 Unilateral primary osteoarthritis, left hip: Secondary | ICD-10-CM | POA: Diagnosis not present

## 2024-01-25 DIAGNOSIS — M1612 Unilateral primary osteoarthritis, left hip: Secondary | ICD-10-CM | POA: Diagnosis not present

## 2024-02-06 DIAGNOSIS — M1612 Unilateral primary osteoarthritis, left hip: Secondary | ICD-10-CM | POA: Diagnosis not present

## 2024-02-16 DIAGNOSIS — M25552 Pain in left hip: Secondary | ICD-10-CM | POA: Diagnosis not present

## 2024-02-17 DIAGNOSIS — Z789 Other specified health status: Secondary | ICD-10-CM | POA: Diagnosis not present

## 2024-02-17 DIAGNOSIS — Z125 Encounter for screening for malignant neoplasm of prostate: Secondary | ICD-10-CM | POA: Diagnosis not present

## 2024-02-17 DIAGNOSIS — E7800 Pure hypercholesterolemia, unspecified: Secondary | ICD-10-CM | POA: Diagnosis not present

## 2024-02-17 DIAGNOSIS — Z0000 Encounter for general adult medical examination without abnormal findings: Secondary | ICD-10-CM | POA: Diagnosis not present

## 2024-04-09 DIAGNOSIS — M1612 Unilateral primary osteoarthritis, left hip: Secondary | ICD-10-CM | POA: Diagnosis not present

## 2024-04-13 DIAGNOSIS — Z01818 Encounter for other preprocedural examination: Secondary | ICD-10-CM | POA: Diagnosis not present

## 2024-04-13 DIAGNOSIS — E7800 Pure hypercholesterolemia, unspecified: Secondary | ICD-10-CM | POA: Diagnosis not present

## 2024-04-30 DIAGNOSIS — Z1211 Encounter for screening for malignant neoplasm of colon: Secondary | ICD-10-CM | POA: Diagnosis not present

## 2024-04-30 DIAGNOSIS — K635 Polyp of colon: Secondary | ICD-10-CM | POA: Diagnosis not present

## 2024-06-11 DIAGNOSIS — M1612 Unilateral primary osteoarthritis, left hip: Secondary | ICD-10-CM | POA: Diagnosis not present

## 2024-06-21 DIAGNOSIS — M1612 Unilateral primary osteoarthritis, left hip: Secondary | ICD-10-CM | POA: Diagnosis not present

## 2024-07-19 DIAGNOSIS — Z23 Encounter for immunization: Secondary | ICD-10-CM | POA: Diagnosis not present
